# Patient Record
Sex: Male | Born: 1975 | Race: White | Hispanic: No | Marital: Married | Smoking: Current every day smoker
Health system: Southern US, Community
[De-identification: ages and names within clinical notes are randomized; demographics above are authoritative.]

## PROBLEM LIST (undated history)

## (undated) DIAGNOSIS — I10 Essential (primary) hypertension: Secondary | ICD-10-CM

## (undated) HISTORY — DX: Essential (primary) hypertension: I10

---

## 2012-08-11 ENCOUNTER — Ambulatory Visit: Payer: Self-pay | Admitting: Internal Medicine

## 2013-07-26 IMAGING — CR DG CHEST 2V
1 series · 2 of 2 positions shown · non-contrast
Comparison: none

REASON FOR EXAM: cough and congestion for 3 days
COMMENTS:

PROCEDURE:     MDR - MDR CHEST PA(OR AP) AND LATERAL  - August 11, 2012 [DATE]
RESULT:     Comparison: None.

[Series 1: pa · 0.17mm/px · 2 of 2 slices shown]
[im 1/2]
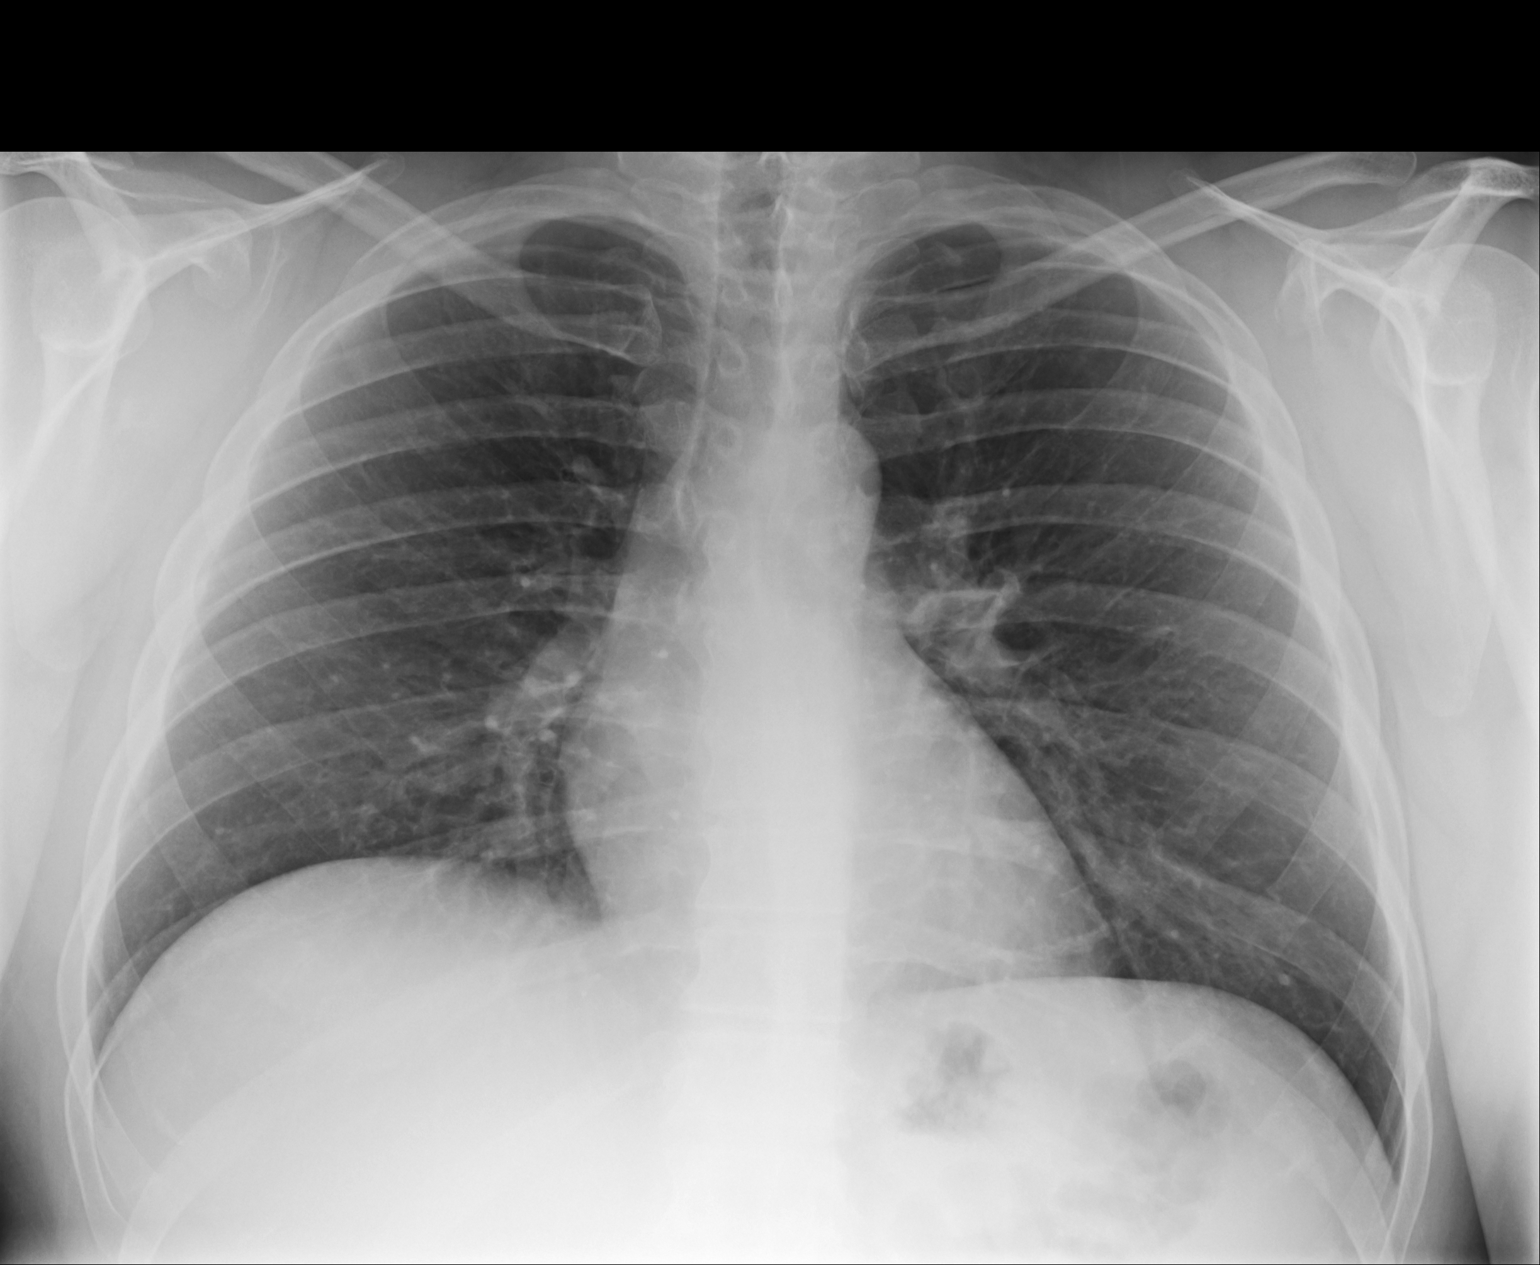
[im 2/2]
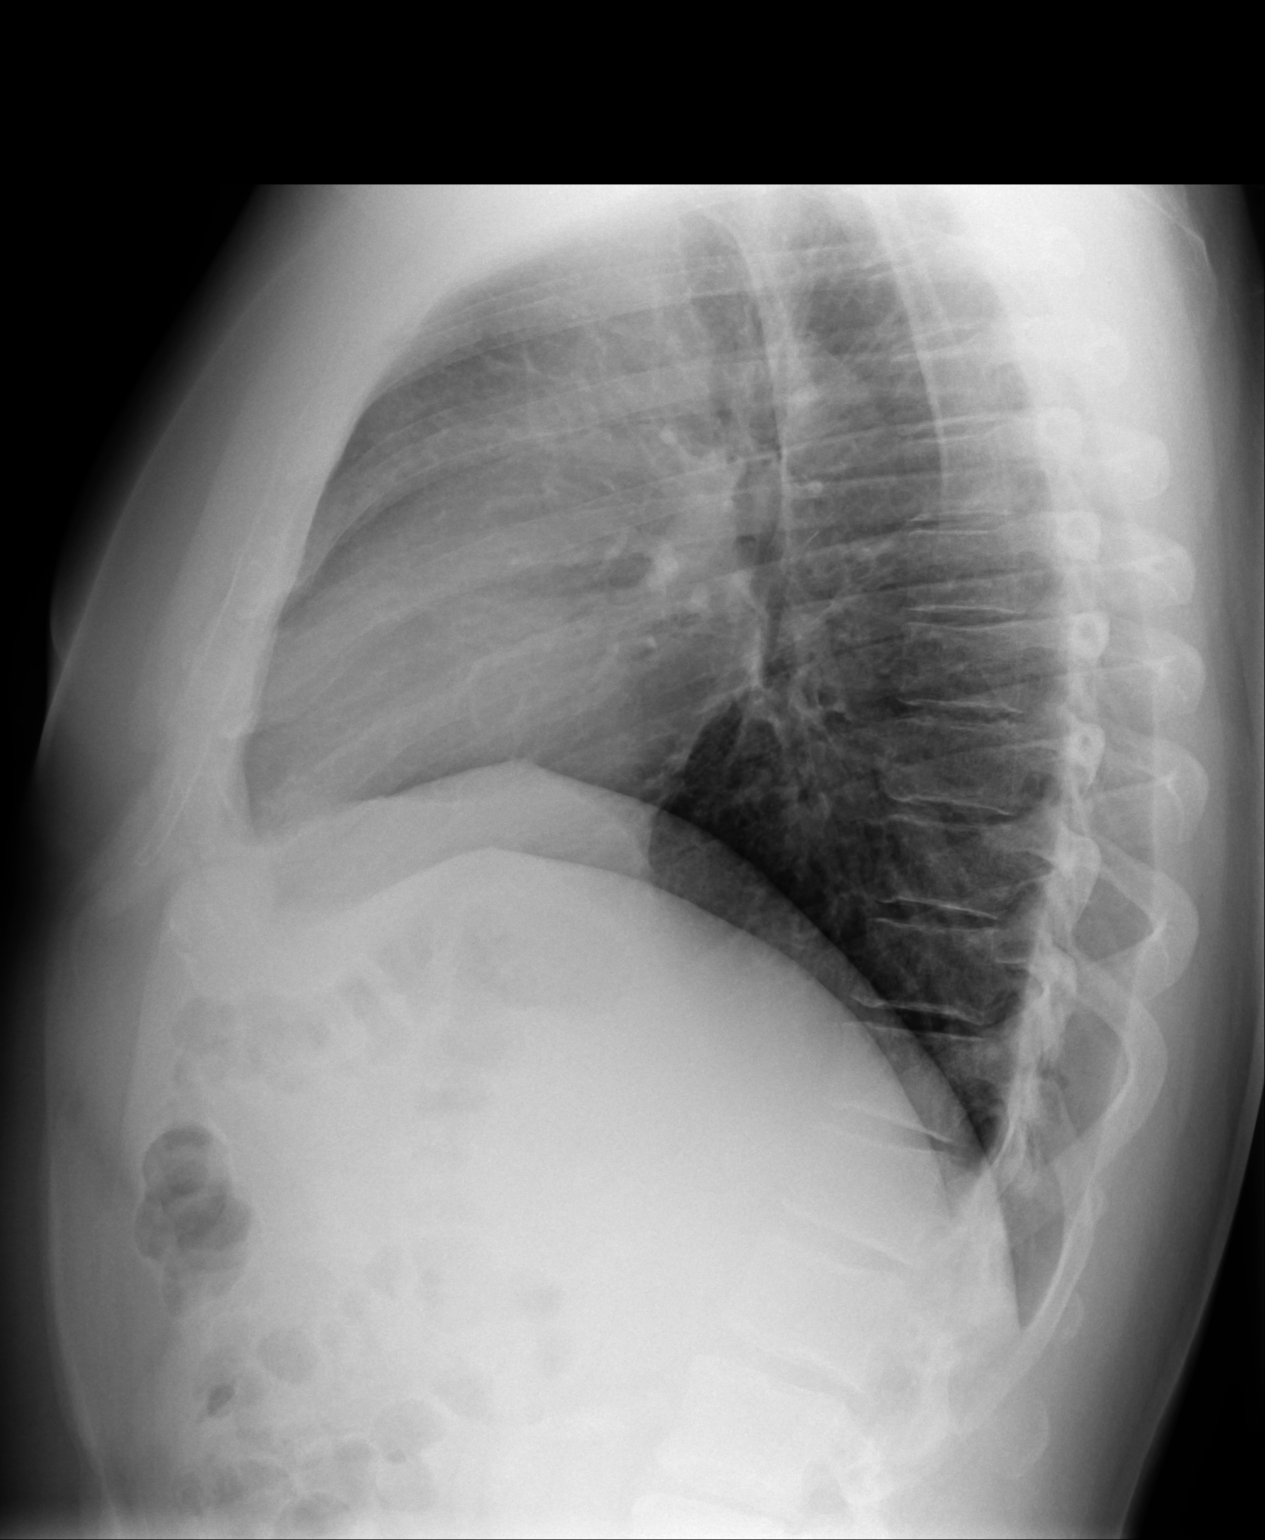

[2 of 2 positions shown; findings below may reference images not displayed]

FINDINGS: The heart and mediastinum are within normal limits. No focal pulmonary
opacities.
IMPRESSION: No acute cardiopulmonary disease.

[REDACTED]

## 2015-08-14 ENCOUNTER — Ambulatory Visit
Admission: EM | Admit: 2015-08-14 | Discharge: 2015-08-14 | Disposition: A | Discharge status: Home / Self Care | Payer: BLUE CROSS/BLUE SHIELD | Attending: Family Medicine | Admitting: Family Medicine

## 2015-08-14 ENCOUNTER — Encounter: Payer: Self-pay | Admitting: Emergency Medicine

## 2015-08-14 DIAGNOSIS — J0100 Acute maxillary sinusitis, unspecified: Secondary | ICD-10-CM

## 2015-08-14 MED ORDER — FEXOFENADINE-PSEUDOEPHED ER 180-240 MG PO TB24: 1.0000 | ORAL_TABLET | Freq: Every day | ORAL | Status: AC

## 2015-08-14 MED ORDER — AMOXICILLIN-POT CLAVULANATE 875-125 MG PO TABS: 1.0000 | ORAL_TABLET | Freq: Two times a day (BID) | ORAL | Status: DC

## 2015-08-14 MED ORDER — FLUTICASONE PROPIONATE 50 MCG/ACT NA SUSP: 2.0000 | Freq: Every day | NASAL | Status: AC

## 2015-08-14 NOTE — Discharge Instructions (Signed)
Sinusitis, Adult °Sinusitis is redness, soreness, and puffiness (inflammation) of the air pockets in the bones of your face (sinuses). The redness, soreness, and puffiness can cause air and mucus to get trapped in your sinuses. This can allow germs to grow and cause an infection.  °HOME CARE  °· Drink enough fluids to keep your pee (urine) clear or pale yellow. °· Use a humidifier in your home. °· Run a hot shower to create steam in the bathroom. Sit in the bathroom with the door closed. Breathe in the steam 3-4 times a day. °· Put a warm, moist washcloth on your face 3-4 times a day, or as told by your doctor. °· Use salt water sprays (saline sprays) to wet the thick fluid in your nose. This can help the sinuses drain. °· Only take medicine as told by your doctor. °GET HELP RIGHT AWAY IF:  °· Your pain gets worse. °· You have very bad headaches. °· You are sick to your stomach (nauseous). °· You throw up (vomit). °· You are very sleepy (drowsy) all the time. °· Your face is puffy (swollen). °· Your vision changes. °· You have a stiff neck. °· You have trouble breathing. °MAKE SURE YOU:  °· Understand these instructions. °· Will watch your condition. °· Will get help right away if you are not doing well or get worse. °  °This information is not intended to replace advice given to you by your health care provider. Make sure you discuss any questions you have with your health care provider. °  °Document Released: 02/08/2008 Document Revised: 09/12/2014 Document Reviewed: 03/27/2012 °Elsevier Interactive Patient Education ©2016 [employer] ° °Upper Respiratory Infection, Adult °Most upper respiratory infections (URIs) are caused by a virus. A URI affects the nose, throat, and upper air passages. The most common type of URI is often called "the common cold." °HOME CARE  °· Take medicines only as told by your doctor. °· Gargle warm saltwater or take cough drops to comfort your throat as told by your doctor. °· Use a  warm mist humidifier or inhale steam from a shower to increase air moisture. This may make it easier to breathe. °· Drink enough fluid to keep your pee (urine) clear or pale yellow. °· Eat soups and other clear broths. °· Have a healthy diet. °· Rest as needed. °· Go back to work when your fever is gone or your doctor says it is okay. °¨ You may need to stay home longer to avoid giving your URI to others. °¨ You can also wear a face mask and wash your hands often to prevent spread of the virus. °· Use your inhaler more if you have asthma. °· Do not use any tobacco products, including cigarettes, chewing tobacco, or electronic cigarettes. If you need help quitting, ask your doctor. °GET HELP IF: °· You are getting worse, not better. °· Your symptoms are not helped by medicine. °· You have chills. °· You are getting more short of breath. °· You have brown or red mucus. °· You have yellow or brown discharge from your nose. °· You have pain in your face, especially when you bend forward. °· You have a fever. °· You have puffy (swollen) neck glands. °· You have pain while swallowing. °· You have white areas in the back of your throat. °GET HELP RIGHT AWAY IF:  °· You have very bad or constant: °¨ Headache. °¨ Ear pain. °¨ Pain in your forehead, behind your eyes, and over   your cheekbones (sinus pain). °¨ Chest pain. °· You have long-lasting (chronic) lung disease and any of the following: °¨ Wheezing. °¨ Long-lasting cough. °¨ Coughing up blood. °¨ A change in your usual mucus. °· You have a stiff neck. °· You have changes in your: °¨ Vision. °¨ Hearing. °¨ Thinking. °¨ Mood. °MAKE SURE YOU:  °· Understand these instructions. °· Will watch your condition. °· Will get help right away if you are not doing well or get worse. °  °This information is not intended to replace advice given to you by your health care provider. Make sure you discuss any questions you have with your health care provider. °  °Document Released:  02/08/2008 Document Revised: 01/06/2015 Document Reviewed: 11/27/2013 °Elsevier Interactive Patient Education ©2016 [employer]vier Inc. ° °

## 2015-08-14 NOTE — ED Notes (Signed)
Patient c/o sinus pain and pressure and nasal congestion for a week.  Patient denies fevers. 

## 2015-08-14 NOTE — ED Provider Notes (Signed)
CSN: [id number]     Arrival date & time 08/14/15  1478 History   First MD Initiated Contact with Patient 08/14/15 [phone]    Nurses notes were reviewed. Chief Complaint  Patient presents with  . Facial Pain  . Nasal Congestion   patient reports nasal congestion. He states that he's had nasal congestion pressure over the frontal area as well. No fever started last week Thursday or Friday so this has been going on for 8-9 days. Unfortunately he stills smokes.  (Consider location/radiation/quality/duration/timing/severity/associated sxs/prior Treatment) Patient is a 39 y.o. male presenting with URI. The history is provided by the patient. No language interpreter was used.  URI Presenting symptoms: rhinorrhea   Presenting symptoms: no cough, no ear pain and no sore throat   Severity:  Moderate Duration:  8 days Timing:  Constant Progression:  Worsening Chronicity:  New Relieved by:  Nothing Ineffective treatments:  Decongestant, OTC medications and rest Associated symptoms: headaches and sinus pain   Associated symptoms: no neck pain, no sneezing and no wheezing   Risk factors: chronic respiratory disease   Risk factors: not elderly, no chronic cardiac disease, no chronic kidney disease, no diabetes mellitus, no immunosuppression and no sick contacts     History reviewed. No pertinent past medical history. History reviewed. No pertinent past surgical history. History reviewed. No pertinent family history. Social History  Substance Use Topics  . Smoking status: Current Every Day Smoker -- 1.00 packs/day    Types: Cigarettes  . Smokeless tobacco: None  . Alcohol Use: Yes    Review of Systems  HENT: Positive for rhinorrhea and sinus pressure. Negative for ear pain, sneezing and sore throat.   Respiratory: Negative for cough and wheezing.   Musculoskeletal: Negative for neck pain.  Neurological: Positive for headaches.  All other systems reviewed and are negative.   Allergies   Review of patient's allergies indicates no known allergies.  Home Medications   Prior to Admission medications   Medication Sig Start Date End Date Taking? Authorizing Provider  amoxicillin-clavulanate (AUGMENTIN) 875-125 MG tablet Take 1 tablet by mouth 2 (two) times daily. 08/14/15   Hassan Rowan, MD  fexofenadine-pseudoephedrine (ALLEGRA-D ALLERGY & CONGESTION) 180-240 MG 24 hr tablet Take 1 tablet by mouth daily. 08/14/15   Hassan Rowan, MD  fluticasone (FLONASE) 50 MCG/ACT nasal spray Place 2 sprays into both nostrils daily. 08/14/15   Hassan Rowan, MD   Meds Ordered and Administered this Visit  Medications - No data to display  BP 144/103 mmHg  Pulse 86  Temp(Src) 97.6 F (36.4 C) (Tympanic)  Resp 16  Ht  (1.803 m)  Wt 230 lb (104.327 kg)  BMI 32.09 kg/m2  SpO2 100% No data found.   Physical Exam  Constitutional: He appears well-developed and well-nourished.  HENT:  Head: Normocephalic and atraumatic.  Right Ear: Hearing, tympanic membrane, external ear and ear canal normal.  Left Ear: Tympanic membrane, external ear and ear canal normal.  Nose: Mucosal edema and rhinorrhea present. Right sinus exhibits frontal sinus tenderness.  Mouth/Throat: Oropharynx is clear and moist. He does not have dentures. Normal dentition. No dental abscesses or dental caries. No posterior oropharyngeal edema or posterior oropharyngeal erythema.  Eyes: Conjunctivae are normal. Pupils are equal, round, and reactive to light.  Neck: Normal range of motion. Neck supple.  Musculoskeletal: Normal range of motion.  Lymphadenopathy:    He has cervical adenopathy.  Neurological: He is alert.  Skin: Skin is warm and dry.  Psychiatric: He has a  normal mood and affect.  Vitals reviewed.   ED Course  Procedures (including critical care time)  Labs Review Labs Reviewed - No data to display  Imaging Review No results found.   Visual Acuity Review  Right Eye Distance:   Left Eye  Distance:   Bilateral Distance:    Right Eye Near:   Left Eye Near:    Bilateral Near:         MDM   1. Acute maxillary sinusitis, recurrence not specified      We'll place Augmentin 875 Flonase nasal spray 2 puffs each nostril and Allegra-D 24 hours 1 tablet daily follow-up in a week if not better with PCP work note given for him as well.     Hassan RowanEugene Maleka Contino, MD 08/14/15 [phone]

## 2024-05-24 ENCOUNTER — Ambulatory Visit
Admission: RE | Admit: 2024-05-24 | Discharge: 2024-05-24 | Disposition: A | Discharge status: Home / Self Care | Payer: Self-pay | Source: Ambulatory Visit | Attending: Emergency Medicine | Admitting: Emergency Medicine

## 2024-05-24 VITALS — BP 205/142 | HR 91 | Temp 98.6°F | Resp 15 | Ht 71.0 in | Wt 229.9 lb

## 2024-05-24 DIAGNOSIS — S30861A Insect bite (nonvenomous) of abdominal wall, initial encounter: Secondary | ICD-10-CM | POA: Diagnosis not present

## 2024-05-24 DIAGNOSIS — L03311 Cellulitis of abdominal wall: Secondary | ICD-10-CM | POA: Diagnosis not present

## 2024-05-24 DIAGNOSIS — X58XXXA Exposure to other specified factors, initial encounter: Secondary | ICD-10-CM | POA: Diagnosis not present

## 2024-05-24 DIAGNOSIS — R000 Tachycardia, unspecified: Secondary | ICD-10-CM | POA: Diagnosis not present

## 2024-05-24 DIAGNOSIS — R21 Rash and other nonspecific skin eruption: Secondary | ICD-10-CM | POA: Diagnosis not present

## 2024-05-24 DIAGNOSIS — R079 Chest pain, unspecified: Secondary | ICD-10-CM | POA: Diagnosis not present

## 2024-05-24 DIAGNOSIS — Z79899 Other long term (current) drug therapy: Secondary | ICD-10-CM | POA: Diagnosis not present

## 2024-05-24 DIAGNOSIS — I161 Hypertensive emergency: Secondary | ICD-10-CM | POA: Diagnosis not present

## 2024-05-24 DIAGNOSIS — R9431 Abnormal electrocardiogram [ECG] [EKG]: Secondary | ICD-10-CM | POA: Diagnosis not present

## 2024-05-24 DIAGNOSIS — R059 Cough, unspecified: Secondary | ICD-10-CM | POA: Diagnosis not present

## 2024-05-24 DIAGNOSIS — I10 Essential (primary) hypertension: Secondary | ICD-10-CM | POA: Diagnosis not present

## 2024-05-24 LAB — COMPREHENSIVE METABOLIC PANEL WITH GFR
ALT: 58 U/L — ABNORMAL HIGH (ref 0–44)
AST: 38 U/L (ref 15–41)
Albumin: 4.6 g/dL (ref 3.5–5.0)
Alkaline Phosphatase: 65 U/L (ref 38–126)
Anion gap: 10 (ref 5–15)
BUN: 15 mg/dL (ref 6–20)
CO2: 25 mmol/L (ref 22–32)
Calcium: 9.3 mg/dL (ref 8.9–10.3)
Chloride: 101 mmol/L (ref 98–111)
Creatinine, Ser: 0.71 mg/dL (ref 0.61–1.24)
GFR, Estimated: >60 mL/min (ref >60)
Glucose, Bld: 95 mg/dL (ref 70–99)
Potassium: 4.5 mmol/L (ref 3.5–5.1)
Sodium: 136 mmol/L (ref 135–145)
Total Bilirubin: 0.8 mg/dL (ref 0.0–1.2)
Total Protein: 8.2 g/dL — ABNORMAL HIGH (ref 6.5–8.1)

## 2024-05-24 LAB — CBC WITH DIFFERENTIAL/PLATELET
Abs Immature Granulocytes: 0.05 K/uL (ref 0.00–0.07)
Basophils Absolute: 0.1 K/uL (ref 0.0–0.1)
Basophils Relative: 1 %
Eosinophils Absolute: 0.2 K/uL (ref 0.0–0.5)
Eosinophils Relative: 2 %
HCT: 44.2 % (ref 39.0–52.0)
Hemoglobin: 15.9 g/dL (ref 13.0–17.0)
Immature Granulocytes: 1 %
Lymphocytes Relative: 37 %
Lymphs Abs: 2.8 K/uL (ref 0.7–4.0)
MCH: 31.7 pg (ref 26.0–34.0)
MCHC: 36 g/dL (ref 30.0–36.0)
MCV: 88 fL (ref 80.0–100.0)
Monocytes Absolute: 0.6 K/uL (ref 0.1–1.0)
Monocytes Relative: 8 %
Neutro Abs: 3.8 K/uL (ref 1.7–7.7)
Neutrophils Relative %: 51 %
Platelets: 189 K/uL (ref 150–400)
RBC: 5.02 MIL/uL (ref 4.22–5.81)
RDW: 12.2 % (ref 11.5–15.5)
WBC: 7.4 K/uL (ref 4.0–10.5)
nRBC: 0 % (ref 0.0–0.2)

## 2024-05-24 MED ORDER — DOXYCYCLINE HYCLATE 100 MG PO CAPS: 100.0000 mg | ORAL_CAPSULE | Freq: Two times a day (BID) | ORAL | 0 refills | Status: AC

## 2024-05-24 MED ORDER — CLONIDINE HCL 0.2 MG PO TABS
0.2000 mg | ORAL_TABLET | Freq: Once | ORAL | Status: AC
  Administered 2024-05-24: 0.2 mg via ORAL

## 2024-05-24 NOTE — ED Notes (Signed)
 Patient is being discharged from the Urgent Care and sent to the Emergency Department via POV . Per Venetia Ryan,NP, patient is in need of higher level of care due to Hypertensive emergency. Patient is aware and verbalizes understanding of plan of care.  Vitals:   05/24/24 1257 05/24/24 1259  BP: (!) 179/130 (!) 205/142  Pulse: 91   Resp: 15   Temp: 98.6 F (37 C)   SpO2: 95%

## 2024-05-24 NOTE — Discharge Instructions (Signed)
 As we discussed, your EKG is concerning for left-sided heart strain and your blood pressure is markedly elevated.  We are unable to safely lower your blood pressure here at urgent care and we are unable to draw cardiac enzymes to assess for ongoing heart damage.  Please go to the emergency department at [facility].  Please go now.

## 2024-05-24 NOTE — ED Triage Notes (Signed)
 Patient reports itchy red bumps at his waist line for couple of days.  Patient states that the area has gotten bigger.  Patient denies fevers.

## 2024-05-24 NOTE — ED Provider Notes (Signed)
 [facility]    CSN: [id number] Arrival date & time: 05/24/24  1246      History   Chief Complaint Chief Complaint  Patient presents with   Rash    Appointment    HPI Lance Barton is a 48 y.o. male.   HPI  48 year old male with no documented past medical history presents for evaluation of an itchy red rash on his waistline that has been present for the last month or more.  He reports that it started off as what looked like a bug bite.  It became larger and more round and he started treating it with a combination of ringworm cream and cortisone cream without significant improvement of symptoms.  He states it has not gotten any larger and but has stayed the same.  It was draining a clear fluid until he started using the cortisone cream.  No fever, headache, or joint pain.  Of note is patient's blood pressure is 205/142 in clinic.  He denies any chest pain, changes in vision, dizziness, or shortness of breath.  Patient does not have a PCP.  He has not had a physical in over 10 years.  He is a 1 pack/day smoker and has been for the last 25 years.  He reports that he stopped drinking alcohol 60 days ago.  History reviewed. No pertinent past medical history.  There are no active problems to display for this patient.   History reviewed. No pertinent surgical history.     Home Medications    Prior to Admission medications   Medication Sig Start Date End Date Taking? Authorizing Provider  doxycycline  (VIBRAMYCIN ) 100 MG capsule Take 1 capsule (100 mg total) by mouth 2 (two) times daily for 7 days. 05/24/24 05/31/24 Yes Bernardino Ditch, NP  fexofenadine -pseudoephedrine (ALLEGRA-D ALLERGY & CONGESTION) 180-240 MG 24 hr tablet Take 1 tablet by mouth daily. 08/14/15   Desiderio Beagle, MD  fluticasone  (FLONASE ) 50 MCG/ACT nasal spray Place 2 sprays into both nostrils daily. 08/14/15   Desiderio Beagle, MD    Family History History reviewed. No pertinent family history.  Social  History Social History   Tobacco Use   Smoking status: Every Day    Current packs/day: 1.00    Types: Cigarettes  Vaping Use   Vaping status: Never Used  Substance Use Topics   Alcohol use: Yes   Drug use: Never     Allergies   Patient has no known allergies.   Review of Systems Review of Systems  Constitutional:  Negative for fever.  Eyes:  Negative for visual disturbance.  Respiratory:  Negative for shortness of breath.   Cardiovascular:  Negative for chest pain and palpitations.  Musculoskeletal:  Negative for arthralgias and myalgias.  Skin:  Positive for rash.  Neurological:  Negative for headaches.     Physical Exam Triage Vital Signs ED Triage Vitals  Encounter Vitals Group     BP      Girls Systolic BP Percentile      Girls Diastolic BP Percentile      Boys Systolic BP Percentile      Boys Diastolic BP Percentile      Pulse      Resp      Temp      Temp src      SpO2      Weight      Height      Head Circumference      Peak Flow  Pain Score      Pain Loc      Pain Education      Exclude from Growth Chart    No data found.  Updated Vital Signs BP (!) 205/142 (BP Location: Right Arm)   Pulse 91   Temp 98.6 F (37 C) (Oral)   Resp 15   Ht 5' 11 (1.803 m)   Wt 229 lb 15 oz (104.3 kg)   SpO2 95%   BMI 32.07 kg/m   Visual Acuity Right Eye Distance:   Left Eye Distance:   Bilateral Distance:    Right Eye Near:   Left Eye Near:    Bilateral Near:     Physical Exam Vitals and nursing note reviewed.  Constitutional:      Appearance: Normal appearance. He is not ill-appearing.  HENT:     Head: Normocephalic and atraumatic.  Cardiovascular:     Rate and Rhythm: Normal rate and regular rhythm.     Pulses: Normal pulses.     Heart sounds: Normal heart sounds. No murmur heard.    No friction rub. No gallop.  Pulmonary:     Effort: Pulmonary effort is normal.     Breath sounds: Normal breath sounds. No wheezing, rhonchi or rales.   Skin:    General: Skin is warm and dry.     Capillary Refill: Capillary refill takes less than 2 seconds.     Findings: Erythema present.  Neurological:     General: No focal deficit present.     Mental Status: He is alert and oriented to person, place, and time.      UC Treatments / Results  Labs (all labs ordered are listed, but only abnormal results are displayed) Labs Reviewed  COMPREHENSIVE METABOLIC PANEL WITH GFR - Abnormal; Notable for the following components:      Result Value   Total Protein 8.2 (*)    ALT 58 (*)    All other components within normal limits  CBC WITH DIFFERENTIAL/PLATELET    EKG Normal sinus rhythm with ventricular rate of 87 bpm PR interval 140 ms QRS duration 88 ms QT/QTc 390/469 ms Biphasic T waves in all the precordial leads.  No ST or T wave abnormalities noted.  Minimal voltage criteria for LVH.  Radiology No results found.  Procedures Procedures (including critical care time)  Medications Ordered in UC Medications  cloNIDine  (CATAPRES ) tablet 0.2 mg (0.2 mg Oral Given 05/24/24 1333)    Initial Impression / Assessment and Plan / UC Course  I have reviewed the triage vital signs and the nursing notes.  Pertinent labs & imaging results that were available during my care of the patient were reviewed by me and considered in my medical decision making (see chart for details).   Patient is a nontoxic-appearing 48 year old male presenting for evaluation of an itchy red rash around his abdomen that has been present for the last month.  Of note at triage his blood pressure was initially 205/142.  When they retook it it was 179/130.  Patient has no documented history of hypertension.  He reports that he has not seen a doctor or had a physical in at least 10 years.  He is a 1 pack-a-day smoker and has been for the last 25 years.  He was a regular drinker but reports that he quit drinking alcohol 60 days ago.  He does not currently have a PCP.   Cardiopulmonary exam is benign.  However, I will obtain an EKG to  evaluate for any evidence of heart strain as well as check a CBC and CMP to evaluate for any evidence of systemic infection and also to evaluate patient's kidney function.  The rash on the patient's abdomen is centrally located inferior to the umbilicus.  Patient seen image above, it consists of a large ovoid area that is red, raised, and hot to touch.  Patient does not complain of any significant tenderness to palpation.  He reports that the area started off as a bug bite and then became larger, more red, and more circular.  He describes it as being itchy as well.  It has been draining clear fluid but it is not longer draining clear fluid since he started using cortisone.  Given that it is raised, erythematous, and hot to touch I am concerned that there is developing cellulitis.  EKG shows normal sinus rhythm with minimal voltage criteria for LVH.  Biphasic P wave in the precordial leads.  No ST or T wave abnormalities noted.  No other tracings available for comparison in epic.  Biphasic T waves in precordial leads is concerning for possible left atrial enlargement.  Given patient's marked hypertension of unknown duration this is a distinct possibility.  I discussed the case with Dr. Kriste and she is encouraging referral to the emergency department.  I agree that the patient should be evaluated in the ER.  I we will have staff administer 0.2 of clonidine  while the patient is here and his blood work is pending.  I will have staff recheck blood pressure in 20 minutes.  CBC shows normal white count of 7.4.  H&H is 15.9 and 44.2.  Platelets are normal at 189.  No abnormalities the differential.  CMP shows normal electrolytes and renal function.  Total protein is mildly elevated 8.2 and ALT is mildly elevated at 58.  AST, alk phos, T. bili are all normal.  I have discussed the findings of the blood work and EKG with the patient and let him  know that there is evidence of left-sided heart strain without evidence of acute MI at present.  I do feel that he would best be served by being evaluated in the emergency department where he can have a troponin drawn to assess for heart damage as well as his blood pressure safely lowered using intravenous medication.  He has elected to go to [facility].  I will treat his cellulitis to his abdomen with 7-day course of doxycycline .  Prescription has been sent to the pharmacy.   Final Clinical Impressions(s) / UC Diagnoses   Final diagnoses:  Hypertensive emergency  Cellulitis of abdominal wall     Discharge Instructions      As we discussed, your EKG is concerning for left-sided heart strain and your blood pressure is markedly elevated.  We are unable to safely lower your blood pressure here at urgent care and we are unable to draw cardiac enzymes to assess for ongoing heart damage.  Please go to the emergency department at [facility].  Please go now.     ED Prescriptions     Medication Sig Dispense Auth. Provider   doxycycline  (VIBRAMYCIN ) 100 MG capsule Take 1 capsule (100 mg total) by mouth 2 (two) times daily for 7 days. 14 capsule Bernardino Ditch, NP      PDMP not reviewed this encounter.   Bernardino Ditch, NP 05/24/24 1342

## 2024-06-04 ENCOUNTER — Ambulatory Visit (INDEPENDENT_AMBULATORY_CARE_PROVIDER_SITE_OTHER): Admitting: Family Medicine

## 2024-06-04 ENCOUNTER — Encounter: Payer: Self-pay | Admitting: Family Medicine

## 2024-06-04 VITALS — BP 184/120 | HR 93 | Ht 71.0 in | Wt 232.0 lb

## 2024-06-04 DIAGNOSIS — Z125 Encounter for screening for malignant neoplasm of prostate: Secondary | ICD-10-CM | POA: Diagnosis not present

## 2024-06-04 DIAGNOSIS — Z130 Encounter for screening for diseases of the blood and blood-forming organs and certain disorders involving the immune mechanism: Secondary | ICD-10-CM

## 2024-06-04 DIAGNOSIS — Z136 Encounter for screening for cardiovascular disorders: Secondary | ICD-10-CM

## 2024-06-04 DIAGNOSIS — Z1211 Encounter for screening for malignant neoplasm of colon: Secondary | ICD-10-CM

## 2024-06-04 DIAGNOSIS — Z13220 Encounter for screening for lipoid disorders: Secondary | ICD-10-CM | POA: Diagnosis not present

## 2024-06-04 DIAGNOSIS — Z131 Encounter for screening for diabetes mellitus: Secondary | ICD-10-CM | POA: Diagnosis not present

## 2024-06-04 DIAGNOSIS — I10 Essential (primary) hypertension: Secondary | ICD-10-CM

## 2024-06-04 DIAGNOSIS — Z23 Encounter for immunization: Secondary | ICD-10-CM | POA: Diagnosis not present

## 2024-06-04 MED ORDER — OLMESARTAN MEDOXOMIL 5 MG PO TABS: 5.0000 mg | ORAL_TABLET | Freq: Every day | ORAL | 1 refills | Status: DC

## 2024-06-04 MED ORDER — AMLODIPINE BESYLATE 2.5 MG PO TABS: 2.5000 mg | ORAL_TABLET | Freq: Every day | ORAL | 1 refills | Status: DC

## 2024-06-04 NOTE — Progress Notes (Signed)
 New Patient Office Visit  Subjective    Patient ID: Lance Barton, male    DOB: 1976/04/28  Age: 48 y.o. MRN: [id number]  CC:  Chief Complaint  Patient presents with  . Establish Care    Patient is here to establish care, recently went to the ED for elevated blood pressure. Wants to have a primary care provider to manage blood pressure control     Assessment & Plan:   Hypertension, uncontrolled -     TSH -     Microalbumin / creatinine urine ratio  Encounter for lipid screening for cardiovascular disease -     Lipid panel  Diabetes mellitus screening -     Hemoglobin A1c  Screening for colon cancer  Screening for prostate cancer -     PSA  Screening for iron deficiency anemia  Encounter for immunization -     Flu vaccine trivalent PF, 6mos and older(Flulaval,Afluria,Fluarix,Fluzone)  Other orders -     Olmesartan Medoxomil; Take 1 tablet (5 mg total) by mouth daily.  Dispense: 30 tablet; Refill: 1 -     amLODIPine Besylate; Take 1 tablet (2.5 mg total) by mouth daily.  Dispense: 30 tablet; Refill: 1     Return in about 4 weeks (around 07/02/2024) for Annual.   Lance K Meshulem Onorato, MD   HPI   Lance Barton presents to establish care Discussed the use of AI scribe software for clinical note transcription with the patient, who gave verbal consent to proceed.  History of Present Illness Lance Barton is a 48 year old male who presents for management of high blood pressure.  He was unaware of his hypertension until a recent urgent care visit where his blood pressure was recorded at 205/142 mmHg. He was started on a low dose of amlodipine during a hospital visit and reported feeling 'weird' after taking the medication. He has been monitoring his blood pressure at home, with recent readings ranging from 141/100 to 141/105 mmHg. No symptoms associated with high blood pressure, such as headaches or dizziness.  He has a significant family history of hypertension, with both  parents and paternal grandparents on blood pressure medication. He has not seen a doctor for a long time prior to this episode.  His social history reveals a history of alcohol use, consuming six to eight beers nightly, which he stopped 63 days ago. He smokes about a pack of cigarettes daily and uses marijuana. He has also stopped drinking sweet tea and sodas for the past two weeks.  He reports a rash that has persisted for a month, located at the site of his belt buckle. He has been using a cream and a spray prescribed for a possible fungal infection, but these have not been effective. He suspects the rash may be due to contact with the metal in his belt buckle. The rash is itchy but not painful or spreading.  He is currently taking amlodipine for his blood pressure. He also uses Flonase  for allergies and was previously given an antibiotic for the rash, which he has stopped as it was ineffective.    Outpatient Encounter Medications as of 06/04/2024  Medication Sig  . olmesartan (BENICAR) 5 MG tablet Take 1 tablet (5 mg total) by mouth daily.  SABRA triamcinolone cream (KENALOG) 0.1 % Apply 1 Application topically 2 (two) times daily.  . [DISCONTINUED] amLODipine (NORVASC) 2.5 MG tablet Take 2.5 mg by mouth daily.  . [DISCONTINUED] miconazole (MICOTIN) 2 % cream Apply 1  Application topically 2 (two) times daily.  SABRA amLODipine (NORVASC) 2.5 MG tablet Take 1 tablet (2.5 mg total) by mouth daily.  . fexofenadine -pseudoephedrine (ALLEGRA-D ALLERGY & CONGESTION) 180-240 MG 24 hr tablet Take 1 tablet by mouth daily. (Patient not taking: Reported on 06/04/2024)  . fluticasone  (FLONASE ) 50 MCG/ACT nasal spray Place 2 sprays into both nostrils daily. (Patient not taking: Reported on 06/04/2024)   No facility-administered encounter medications on file as of 06/04/2024.      Review of Systems  All other systems reviewed and are negative.       Objective    BP (!) 184/120   Pulse 93   Ht 5' 11 (1.803  m)   Wt 232 lb (105.2 kg)   SpO2 99%   BMI 32.36 kg/m   Physical Exam Vitals and nursing note reviewed.  Constitutional:      Appearance: Normal appearance.  HENT:     Head: Normocephalic.     Right Ear: External ear normal.     Left Ear: External ear normal.  Eyes:     Conjunctiva/sclera: Conjunctivae normal.  Cardiovascular:     Rate and Rhythm: Normal rate.  Pulmonary:     Effort: Pulmonary effort is normal. No respiratory distress.  Abdominal:     Palpations: Abdomen is soft.  Musculoskeletal:        General: Normal range of motion.  Skin:    General: Skin is warm.  Neurological:     Mental Status: He is alert and oriented to person, place, and time.  Psychiatric:        Mood and Affect: Mood normal.

## 2024-06-05 ENCOUNTER — Ambulatory Visit: Payer: Self-pay | Admitting: Family Medicine

## 2024-06-05 LAB — LIPID PANEL
Chol/HDL Ratio: 4.3 ratio (ref 0.0–5.0)
Cholesterol, Total: 154 mg/dL (ref 100–199)
HDL: 36 mg/dL — ABNORMAL LOW (ref >39)
LDL Chol Calc (NIH): 97 mg/dL (ref 0–99)
Triglycerides: 112 mg/dL (ref 0–149)
VLDL Cholesterol Cal: 21 mg/dL (ref 5–40)

## 2024-06-05 LAB — PSA: Prostate Specific Ag, Serum: 0.6 ng/mL (ref 0.0–4.0)

## 2024-06-05 LAB — HEMOGLOBIN A1C
Est. average glucose Bld gHb Est-mCnc: 120 mg/dL
Hgb A1c MFr Bld: 5.8 % — ABNORMAL HIGH (ref 4.8–5.6)

## 2024-06-05 LAB — TSH: TSH: 0.413 u[IU]/mL — ABNORMAL LOW (ref 0.450–4.500)

## 2024-06-06 ENCOUNTER — Other Ambulatory Visit: Payer: Self-pay | Admitting: Family Medicine

## 2024-06-06 MED ORDER — METFORMIN HCL 500 MG PO TABS: 500.0000 mg | ORAL_TABLET | Freq: Two times a day (BID) | ORAL | 3 refills | Status: DC

## 2024-06-17 ENCOUNTER — Telehealth: Payer: Self-pay

## 2024-06-17 MED ORDER — AMLODIPINE BESYLATE 2.5 MG PO TABS: 2.5000 mg | ORAL_TABLET | Freq: Every day | ORAL | 0 refills | Status: DC

## 2024-06-17 NOTE — Addendum Note (Signed)
 Addended by: CAMACHO OCAMPO, Caycee Wanat M on: 06/17/2024 03:18 PM   Modules accepted: Orders

## 2024-06-17 NOTE — Addendum Note (Signed)
 Addended by: CAMACHO OCAMPO, Rikita Grabert M on: 06/17/2024 03:20 PM   Modules accepted: Orders

## 2024-06-17 NOTE — Telephone Encounter (Signed)
 Spoke with Lance Barton, offered to send in a 7 day supply of Amlodipine medication to [facility] with a possible out of pocket cost. She verbalized understanding and agreed to get a [employer] coupon to help with out of pocket cost

## 2024-06-17 NOTE — Telephone Encounter (Signed)
 Copied from CRM [id number]. Topic: Clinical - Medication Question >> Jun 17, 2024  2:08 PM Antwanette L wrote: Reason for CRM: Rilla, the patient's wife, called to request a refill for Amlodipine (Norvasc) 2.5 mg tablets. The patient is currently out of the medication, and [facility] is unable to refill it until October 16 . Jeanette can be contacted at [phone]

## 2024-07-02 ENCOUNTER — Ambulatory Visit (INDEPENDENT_AMBULATORY_CARE_PROVIDER_SITE_OTHER): Admitting: Family Medicine

## 2024-07-02 ENCOUNTER — Other Ambulatory Visit: Payer: Self-pay | Admitting: Family Medicine

## 2024-07-02 ENCOUNTER — Encounter: Payer: Self-pay | Admitting: Family Medicine

## 2024-07-02 VITALS — BP 136/84 | HR 81 | Ht 71.0 in | Wt 233.0 lb

## 2024-07-02 DIAGNOSIS — Z0000 Encounter for general adult medical examination without abnormal findings: Secondary | ICD-10-CM

## 2024-07-02 DIAGNOSIS — R7989 Other specified abnormal findings of blood chemistry: Secondary | ICD-10-CM

## 2024-07-02 DIAGNOSIS — Z1211 Encounter for screening for malignant neoplasm of colon: Secondary | ICD-10-CM | POA: Diagnosis not present

## 2024-07-02 DIAGNOSIS — I10 Essential (primary) hypertension: Secondary | ICD-10-CM | POA: Diagnosis not present

## 2024-07-02 DIAGNOSIS — R7303 Prediabetes: Secondary | ICD-10-CM | POA: Diagnosis not present

## 2024-07-02 MED ORDER — METFORMIN HCL 1000 MG PO TABS: 1000.0000 mg | ORAL_TABLET | Freq: Two times a day (BID) | ORAL | 3 refills | Status: AC

## 2024-07-02 MED ORDER — OLMESARTAN MEDOXOMIL 20 MG PO TABS: 10.0000 mg | ORAL_TABLET | Freq: Every day | ORAL | 1 refills | Status: AC

## 2024-07-02 MED ORDER — AMLODIPINE BESYLATE 5 MG PO TABS: 5.0000 mg | ORAL_TABLET | Freq: Every day | ORAL | 1 refills | Status: AC

## 2024-07-02 NOTE — Progress Notes (Signed)
 Complete physical exam  Patient: Lance Barton    DOB: 1976-04-21 48 y.o.   MRN: [id number]  Chief Complaint  Patient presents with   Annual Exam    Subjective:    Lance Barton is a 48 y.o. male who presents today for a complete physical exam. He reports consuming a general and low fat diet. The patient does not participate in regular exercise at present. He generally feels fairly well. He reports sleeping fairly well. He does not have additional problems to discuss today.   Discussed the use of AI scribe software for clinical note transcription with the patient, who gave verbal consent to proceed.  History of Present Illness Lance Barton is a 48 year old male with hypertension and prediabetes who presents for blood pressure management and medication adjustment.  He has been monitoring his blood pressure at home, with systolic readings between 125-128 mmHg and diastolic readings around 83-85 mmHg. He is currently taking two 2.5 mg tablets of amlodipine in the morning and one 5 mg tablet of olmesartan at night. He reports no lightheadedness and feels less tired than before.  He is managing prediabetes with metformin, taking one tablet in the morning and one at night. He experiences no issues with diarrhea, even when taken on an empty stomach.  He mentions a spot on his leg that started as a small dry patch and has grown over a couple of years. It is not itchy or painful, but he is concerned due to his mother's history of skin cancer. The spot is described as brown and slightly raised after showering.  He is trying to quit smoking and has not smoked in eight days, using nicotine patches to aid cessation. He does not consume alcohol. He engages in physical activity by walking and hiking trails with his wife.  He has a family history of colon issues, as his father had some colon problems. He has not yet scheduled a colonoscopy.   Most recent fall risk assessment:    06/04/2024    1:46  PM  Fall Risk   Falls in the past year? 0  Number falls in past yr: 0  Injury with Fall? 0  Risk for fall due to : No Fall Risks  Follow up Falls evaluation completed     Most recent depression screenings:    06/04/2024    3:14 PM  PHQ 2/9 Scores  PHQ - 2 Score 1  PHQ- 9 Score 4    Vision:Not within last year     Patient Care Team: Lashundra Shiveley K, MD as PCP - General (Family Medicine)   Review of Systems  All other systems reviewed and are negative.     Objective:    BP 136/84   Pulse 81   Ht 5' 11 (1.803 m)   Wt 233 lb (105.7 kg)   SpO2 99%   BMI 32.50 kg/m  BP Readings from Last 3 Encounters:  07/02/24 136/84  06/04/24 (!) 184/120  05/24/24 (!) 205/142      Physical Exam Vitals and nursing note reviewed.  Constitutional:      Appearance: Normal appearance.  HENT:     Head: Normocephalic.     Right Ear: External ear normal.     Left Ear: External ear normal.  Eyes:     Conjunctiva/sclera: Conjunctivae normal.  Cardiovascular:     Rate and Rhythm: Normal rate.  Pulmonary:     Effort: Pulmonary effort is normal. No respiratory distress.  Abdominal:     Palpations: Abdomen is soft.  Musculoskeletal:        General: Normal range of motion.  Skin:    General: Skin is warm.  Neurological:     Mental Status: He is alert and oriented to person, place, and time.  Psychiatric:        Mood and Affect: Mood normal.     Physical Exam VITALS: BP- 136/84 SKIN: Raised dry skin lesion on leg, increasing in size.    No results found for any visits on 07/02/24.       Assessment & Plan:    Routine Health Maintenance and Physical Exam Immunization History  Administered Date(s) Administered   Influenza, Seasonal, Injecte, Preservative Fre 06/04/2024    Health Maintenance  Topic Date Due   HIV Screening  Never done   Hepatitis C Screening  Never done   DTaP/Tdap/Td (1 - Tdap) Never done   Pneumococcal Vaccine (1 of 2 - PCV) Never done    Hepatitis B Vaccines 19-59 Average Risk (1 of 3 - 19+ 3-dose series) Never done   Colonoscopy  Never done   COVID-19 Vaccine (1 - 2025-26 season) 06/20/2025 (Originally 05/06/2024)   Influenza Vaccine  Completed   HPV VACCINES  Aged Out   Meningococcal B Vaccine  Aged Out    Discussed health benefits of physical activity, and encouraged him to engage in regular exercise appropriate for his age and condition.  Problem List Items Addressed This Visit   None Visit Diagnoses       Annual physical exam    -  Primary     Abnormal TSH       Relevant Orders   TSH   T4, free     Prediabetes         Essential hypertension       Relevant Medications   olmesartan (BENICAR) 20 MG tablet   amLODipine (NORVASC) 5 MG tablet   Other Relevant Orders   Creatinine, serum     Screening for colon cancer       Relevant Orders   Ambulatory referral to Gastroenterology       Assessment and Plan Assessment & Plan Adult Wellness Visit Routine wellness visit focused on health maintenance, smoking cessation, and colonoscopy due to family history. - Encouraged healthy diet low in sugars, bread, and potatoes. - Encouraged regular exercise such as walking and hiking. - Continue nicotine patches for smoking cessation. - Schedule colonoscopy due to family history of colon issues. - Refer to [facility] for skin lesion evaluation and potential skin tag removal.  Essential hypertension Hypertension well-controlled with home readings around 125-128/84-85 mmHg. Current regimen includes olmesartan and amlodipine. Discussed combination pill but opted for separate medications for dosing flexibility and cost. Goal: maintain BP <130/80 mmHg. - Prescribe olmesartan 10 mg daily, take half a tablet daily. - Prescribe amlodipine 5 mg daily, take one tablet in the morning. - Monitor for lightheadedness, adjust dose if necessary. - Follow up in four months to reassess BP control.  Prediabetes Prediabetes  managed with metformin. Discussed dietary modifications to include more fruits and vegetables, reduce starch. Current metformin regimen well-tolerated. - Increase metformin to 2000 mg daily, 1000 mg morning and evening with meals. - Encourage dietary modifications to include more fruits and vegetables, reduce starch intake.  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 No immediate symptoms reported. - Order repeat thyroid function test.    Return in about 4 months (  around 11/02/2024) for HTN with PCP.    Vinary K Kayln Garceau, MD [facility] at [facility]

## 2024-07-03 LAB — TSH: TSH: 0.456 u[IU]/mL (ref 0.450–4.500)

## 2024-07-03 LAB — CREATININE, SERUM
Creatinine, Ser: 0.72 mg/dL — ABNORMAL LOW (ref 0.76–1.27)
eGFR: 114 mL/min/1.73 (ref >59)

## 2024-07-03 LAB — T4, FREE: Free T4: 1.13 ng/dL (ref 0.82–1.77)

## 2024-07-04 ENCOUNTER — Ambulatory Visit: Payer: Self-pay | Admitting: Family Medicine

## 2024-10-29 ENCOUNTER — Ambulatory Visit: Admitting: Family Medicine
